# Patient Record
Sex: Female | Born: 1935 | Race: White | Hispanic: No | Marital: Married
Health system: Southern US, Community
[De-identification: ages and names within clinical notes are randomized; demographics above are authoritative.]

---

## 2010-02-18 ENCOUNTER — Ambulatory Visit: Payer: Self-pay

## 2010-03-16 ENCOUNTER — Ambulatory Visit: Payer: Self-pay | Admitting: Pain Medicine

## 2010-03-25 ENCOUNTER — Ambulatory Visit: Payer: Self-pay | Admitting: Pain Medicine

## 2010-04-12 ENCOUNTER — Ambulatory Visit: Payer: Self-pay | Admitting: Pain Medicine

## 2010-04-22 ENCOUNTER — Ambulatory Visit: Payer: Self-pay | Admitting: Pain Medicine

## 2010-05-04 ENCOUNTER — Ambulatory Visit: Payer: Self-pay | Admitting: Pain Medicine

## 2010-05-19 ENCOUNTER — Ambulatory Visit: Payer: Self-pay | Admitting: Pain Medicine

## 2011-03-18 ENCOUNTER — Ambulatory Visit: Payer: Self-pay | Admitting: Unknown Physician Specialty

## 2011-03-18 LAB — BASIC METABOLIC PANEL WITH GFR
Anion Gap: 8 (ref 7–16)
BUN: 25 mg/dL — ABNORMAL HIGH (ref 7–18)
Calcium, Total: 9.1 mg/dL (ref 8.5–10.1)
Chloride: 102 mmol/L (ref 98–107)
Co2: 28 mmol/L (ref 21–32)
Creatinine: 1.44 mg/dL — ABNORMAL HIGH (ref 0.60–1.30)
EGFR (African American): 45 — ABNORMAL LOW
EGFR (Non-African Amer.): 37 — ABNORMAL LOW
Glucose: 92 mg/dL (ref 65–99)
Osmolality: 280 (ref 275–301)
Potassium: 4.2 mmol/L (ref 3.5–5.1)
Sodium: 138 mmol/L (ref 136–145)

## 2011-03-18 LAB — CBC
HCT: 36.5 % (ref 35.0–47.0)
HGB: 12.2 g/dL (ref 12.0–16.0)
MCH: 31.5 pg (ref 26.0–34.0)
MCHC: 33.5 g/dL (ref 32.0–36.0)
MCV: 94 fL (ref 80–100)
Platelet: 238 x10 3/mm 3 (ref 150–440)
RBC: 3.89 X10 6/mm 3 (ref 3.80–5.20)
RDW: 14 % (ref 11.5–14.5)
WBC: 12.5 x10 3/mm 3 — ABNORMAL HIGH (ref 3.6–11.0)

## 2011-07-05 ENCOUNTER — Ambulatory Visit: Payer: Self-pay | Admitting: Orthopedic Surgery

## 2011-11-10 ENCOUNTER — Inpatient Hospital Stay: Payer: Self-pay | Admitting: Specialist

## 2011-11-10 LAB — URINALYSIS, COMPLETE
Bilirubin,UR: NEGATIVE
Glucose,UR: NEGATIVE mg/dL (ref 0–75)
Ketone: NEGATIVE
RBC,UR: 15 /HPF (ref 0–5)
Specific Gravity: 1.012 (ref 1.003–1.030)
Squamous Epithelial: NONE SEEN

## 2011-11-10 LAB — CBC WITH DIFFERENTIAL/PLATELET
Basophil #: 0.1 10*3/uL (ref 0.0–0.1)
Basophil %: 0.6 %
Eosinophil %: 1.3 %
HCT: 33.8 % — ABNORMAL LOW (ref 35.0–47.0)
HGB: 11.9 g/dL — ABNORMAL LOW (ref 12.0–16.0)
Lymphocyte #: 3.3 10*3/uL (ref 1.0–3.6)
Lymphocyte %: 21.7 %
MCH: 33.5 pg (ref 26.0–34.0)
MCHC: 35.2 g/dL (ref 32.0–36.0)
MCV: 95 fL (ref 80–100)
Monocyte %: 8.8 %
Neutrophil #: 10.3 10*3/uL — ABNORMAL HIGH (ref 1.4–6.5)

## 2011-11-10 LAB — PROTIME-INR
INR: 1.3
Prothrombin Time: 16.7 secs — ABNORMAL HIGH (ref 11.5–14.7)

## 2011-11-10 LAB — BASIC METABOLIC PANEL
BUN: 15 mg/dL (ref 7–18)
Calcium, Total: 8.8 mg/dL (ref 8.5–10.1)
Chloride: 105 mmol/L (ref 98–107)
Co2: 25 mmol/L (ref 21–32)
EGFR (African American): 49 — ABNORMAL LOW
Glucose: 119 mg/dL — ABNORMAL HIGH (ref 65–99)
Potassium: 3.8 mmol/L (ref 3.5–5.1)

## 2011-11-11 LAB — CBC WITH DIFFERENTIAL/PLATELET
Basophil %: 0.7 %
Eosinophil %: 2.1 %
HGB: 11.4 g/dL — ABNORMAL LOW (ref 12.0–16.0)
Lymphocyte #: 3.2 10*3/uL (ref 1.0–3.6)
MCH: 33.3 pg (ref 26.0–34.0)
MCV: 97 fL (ref 80–100)
Monocyte #: 1.8 x10 3/mm — ABNORMAL HIGH (ref 0.2–0.9)
Monocyte %: 15.3 %
RBC: 3.41 10*6/uL — ABNORMAL LOW (ref 3.80–5.20)
WBC: 11.7 10*3/uL — ABNORMAL HIGH (ref 3.6–11.0)

## 2011-11-11 LAB — BASIC METABOLIC PANEL
Co2: 25 mmol/L (ref 21–32)
Creatinine: 0.99 mg/dL (ref 0.60–1.30)
EGFR (African American): >60
EGFR (Non-African Amer.): 53 — ABNORMAL LOW
Glucose: 120 mg/dL — ABNORMAL HIGH (ref 65–99)
Sodium: 138 mmol/L (ref 136–145)

## 2011-11-12 LAB — BASIC METABOLIC PANEL
Anion Gap: 7 (ref 7–16)
BUN: 13 mg/dL (ref 7–18)
Creatinine: 1.16 mg/dL (ref 0.60–1.30)
EGFR (African American): 51 — ABNORMAL LOW
Glucose: 118 mg/dL — ABNORMAL HIGH (ref 65–99)
Potassium: 4.8 mmol/L (ref 3.5–5.1)

## 2011-11-15 ENCOUNTER — Encounter: Payer: Self-pay | Admitting: Internal Medicine

## 2011-11-15 LAB — URINALYSIS, COMPLETE
Hyaline Cast: 6
Ketone: NEGATIVE
Leukocyte Esterase: NEGATIVE
Nitrite: NEGATIVE
Ph: 6 (ref 4.5–8.0)
Protein: NEGATIVE
Specific Gravity: 1 (ref 1.003–1.030)
WBC UR: 2 /HPF (ref 0–5)

## 2011-11-15 LAB — HEMOGLOBIN: HGB: 8.5 g/dL — ABNORMAL LOW (ref 12.0–16.0)

## 2011-11-16 LAB — PATHOLOGY REPORT

## 2011-11-22 ENCOUNTER — Ambulatory Visit: Payer: Self-pay | Admitting: Internal Medicine

## 2011-12-02 ENCOUNTER — Encounter: Payer: Self-pay | Admitting: Internal Medicine

## 2014-05-20 NOTE — Op Note (Signed)
PATIENT NAME:  Christine BlanksBOWMAN, Valissa MR#:  [id number] DATE OF BIRTH:  [birth year]  DATE OF PROCEDURE:  11/11/2011  PREOPERATIVE DIAGNOSIS: Displaced left femoral neck fracture.   POSTOPERATIVE DIAGNOSIS: Displaced left femoral neck fracture.   PROCEDURE: Moore prosthetic left femoral head replacement.   SURGEON: Clare Gandyhristopher E. Jaylend Reiland, MD  ANESTHESIA: General.   COMPLICATIONS: None.   ESTIMATED BLOOD LOSS: 500 mL.   DRAINS: Two Hemovacs.   DESCRIPTION OF PROCEDURE: 1 gram of Ancef was given intravenously prior to the procedure. General anesthesia was induced. The patient was placed in the right lateral decubitus position in the usual manner for left hip surgery. Standard posterolateral incision is made and the dissection carried down to the fascia lata which is incised in line with the skin incision. Sciatic nerve is palpated deep within the wound and protected throughout the case. Piriformis is identified, cut and tagged off of the femur. The remainder of the external rotators are cut with the cutting Bovie. The capsule is then cut in a T fashion and the ends are tagged with Ethibond sutures. The femoral neck is then cut off at the appropriate length and angle. The femoral head is then removed and measured as a 46 mm in diameter. The acetabulum is irrigated and cleaned of all debris. Proximal femur is broached in the usual manner for a standard Moore prosthesis. The wound is thoroughly irrigated multiple times again including the femur and the acetabulum with pulsatile lavage. The standard 46 mm in diameter AT Moore prosthesis is impacted into place and is seen to be a solid fit. The hip is then reduced and is stable on range of motion. Posterior capsule is repaired and then reattached to the abductor mechanism as well as the piriformis tendon. Two Hemovac drains are brought up through separate stab wound incisions. Fascia lata is closed with Ethibond sutures. Subcutaneous tissue is closed with 2-0 Vicryl.  Skin is closed with the skin stapler. Soft bulky dressing is applied. The patient is turned over into the hospital bed and placed in the abduction pillow and returned to recovery room in satisfactory condition having tolerated the procedure quite well.   ____________________________ Clare Gandyhristopher E. Tanysha Quant, MD ces:cms D: 11/11/2011 11:25:59 ET T: 11/11/2011 11:52:55 ET JOB#: [id number]  cc: Clare Gandyhristopher E. Darivs Lunden, MD, <Dictator> Clare GandyHRISTOPHER E Phillip Sandler MD ELECTRONICALLY SIGNED 11/14/2011 17:30

## 2014-05-20 NOTE — Discharge Summary (Signed)
PATIENT NAME:  Christine Meza, Christine Meza MR#:  [id number] DATE OF BIRTH:  November 01, 1927  DATE OF ADMISSION:  11/10/2011 DATE OF DISCHARGE:  11/15/2011  DISCHARGE DIAGNOSES:  1. Displaced left subcapital femoral neck fracture.  2. Urinary tract infection.  3. Postoperative anemia.  4. Hypothyroidism.  5. Depression.  6. Osteoporosis.  7. History of gastric ulcer.  8. Gastroesophageal reflux disease.   OPERATIONS/PROCEDURES PERFORMED: Moore prosthetic femoral head replacement on 11/11/2011.   HISTORY AND PHYSICAL EXAMINATION: As written on admission.   LABORATORY DATA: As noted in the chart.   HOSPITAL COURSE: The patient was admitted through the Emergency Room and was seen by [facility] for preoperative medical clearance. Their note is as noted within the chart. Laboratory data is as noted in the chart. The patient was taken to the Operating Room on 11/11/2011 where Moore prosthetic femoral head replacement was performed. She tolerated the procedure quite well and generally had a benign postoperative course. She was advanced up into the chair and physical therapy with total hip precautions and weight-bearing as tolerated. She did develop a urinary tract infection and was treated with Septra twice a day. Her hemoglobin stabilized at 8.5, on 11/15/2011, and was felt to be satisfactory. She is to be discharged to rehab. Her orders are as        on the printed order sheet. Please remove her staples in 10 days if her wound is seen to be healing in quite well. Please discontinue her Septra in seven days and then culture her urine. She is to return to the office to see Dr. Katrinka BlazingSmith in three weeks.  ____________________________ Clare Gandyhristopher E. Al Gagen, MD ces:slb D: 11/15/2011 10:35:04 ET T: 11/15/2011 10:51:40 ET JOB#: [id number]  cc: Clare Gandyhristopher E. Tawnie Ehresman, MD, <Dictator> Clare GandyHRISTOPHER E Camar Guyton MD ELECTRONICALLY SIGNED 11/18/2011 7:07

## 2014-05-20 NOTE — Consult Note (Signed)
PATIENT NAME:  Christine Meza, Adlyn MR#:  [id number] DATE OF BIRTH:  Mar 08, 1927  DATE OF CONSULTATION:  11/10/2011  REFERRING PHYSICIAN:  Myra Rudehristopher Smith, MD CONSULTING PHYSICIAN:  Starleen Armsawood S. Mackinzie Vuncannon, MD  REFERRING PHYSICIAN: Dr. Einar CrowMarshall Anderson   CHIEF COMPLAINT: Status post fall and left hip fracture for preop evaluation.   HISTORY OF PRESENT ILLNESS: This is an 81 year old female who lives at home with significant past medical history of hypothyroidism, depression, osteoporosis, and gastroesophageal reflux disease who presents status post mechanical fall. The patient was on the stairs when she was trying to turn around. She fell on her left side and hit her left hip area and left buttocks and complained of significant pain after that. The patient denies any head trauma, any loss of consciousness, any dizziness, altered mental status, or lightheadedness. No chest pain, no shortness of breath, and no palpitations. In the ED the patient had an x-ray which did show a subcapital fracture of the left hip as well as a CT of the head which did not show any acute findings.  The patient had a basic work-up done which did show some mild leukocytosis of 14.2 and a mild urinary tract infection on her urinalysis. The patient was admitted by the orthopedic service and the medical service was consulted for preop evaluation.   PAST MEDICAL HISTORY:  1. Hypothyroidism.  2. Depression.  3. Osteoporosis.  4. History of gastric ulcer.  5. Gastroesophageal reflux disease.   PAST SURGICAL HISTORY:  1. Carpal tunnel surgery.  2. Hysterectomy.  3. Bladder sling.  4. History of stomach surgery due to gastric ulcer, unclear which kind of surgery.   SOCIAL HISTORY: The patient lives at home with her family, has a remote history of smoking, quit almost 50 years ago. No illicit drug or alcohol use.   FAMILY HISTORY: She denies any family history of diabetes or cardiac disease.   ALLERGIES: No known drug allergies.    HOME MEDICATIONS:  1. Tylenol 650, 2 times a day.  2. Tramadol 50 mg, 1 to 2 tablets 4 times a day as needed.  3. Citalopram 40 mg oral daily.  4. Omeprazole 20 mg daily.  5. Levothyroxine 75 mcg daily.  6. Multivitamin 1 tablet oral daily.   REVIEW OF SYSTEMS: Denies any fever, fatigue, weakness. EYES: Denies blurry vision, double vision, or pain. ENT: Denies tinnitus, ear pain, or hearing loss. RESPIRATORY: Denies cough, wheezing, hemoptysis, or dyspnea. CARDIOVASCULAR: Denies chest pain, orthopnea, edema, arrhythmia, palpitations, or syncope. GI: Denies nausea, vomiting, diarrhea, abdominal pain, or hematemesis.  GU: Denies dysuria, hematuria, or renal colic. ENDOCRINE: Denies polyuria, polydipsia, heat or cold intolerance. HEMATOLOGIC: Denies anemia, easy bruising, or bleeding diathesis. INTEGUMENT: Denies acne, rash, or lesions. MUSCULOSKELETAL: Has complaints of lower back pain, chronic. Denies any arthritis, swelling, or gout. NEUROLOGICAL: Denies numbness, dysarthria, epilepsy, tremors, vertigo, ataxia, or dementia.  PSYCHIATRIC: Denies anxiety, insomnia, alcohol abuse, or schizophrenia.   PHYSICAL EXAMINATION:  VITAL SIGNS: Temperature 96.2, pulse 100, respiratory rate 22, blood pressure 140/85, saturating 98% on room air.   GENERAL: Elderly female, looks comfortable, in no apparent distress.   HEENT: Head atraumatic, normocephalic. Pupils equal, reactive to light. Pink conjunctivae. Anicteric sclerae. Moist oral mucosa.   NECK: Supple. No thyromegaly. No JVD.   CHEST: Good air entry bilaterally. No wheezing, rales, or rhonchi.   CARDIOVASCULAR: S1, S2 heard. No rubs, murmurs, or gallops.   ABDOMEN: Soft, nontender, nondistended. Bowel sounds present.   EXTREMITIES: No edema. No clubbing, no cyanosis.  PSYCHIATRIC: Appropriate affect. Awake, alert times three. Intact judgment and insight.   NEUROLOGIC: Cranial nerves grossly intact. Motor five out of five in all extremities.  Motion limited in the left lower extremity secondary to pain.   SKIN: Normal skin turgor. Has left elbow skin tear.   PERTINENT LABORATORY AND RADIOLOGICAL DATA: Glucose 119, BUN 15, creatinine 1.21, sodium 141, potassium 3.8, chloride 105, CO2 25, white blood cells 15.2, hemoglobin 11.9, hematocrit 33.8, and platelets 225. Urinalysis showing nitrites negative, leukocyte esterase +1. White blood cells 19, bacteria +3. CT of the brain shows no acute intracranial abnormalities with cerebral atrophy and chronic small vessel ischemic changes and EKG showing normal sinus rhythm at 99 beats per minute without any significant ST or T wave changes.   ASSESSMENT AND PLAN:  1. Gait disturbance status post mechanical fall with left hip fracture:  This was a mechanical fall. It was not preceded by any altered mental status, dizziness, or lightheadedness. The patient has no complaints of chest pain, no complaints of shortness of breath, no significant cardiac history, no palpitations. The patient is currently a moderate risk for surgical intervention. The patient was admitted under the surgical service with the plan for surgery on Friday,  will be kept n.p.o. after midnight, and on fluids after that.  2. Urinary tract infection: The patient will be started on Rocephin. We will follow urine culture and adjust antibiotics accordingly.  3. Hypothyroidism: Continue with Synthroid.  4. Depression: Continue with citalopram. 5. Gastroesophageal reflux disease and history of gastric ulcer: Continue with omeprazole. 6. Deep vein thrombosis prophylaxis: Subcutaneous heparin.   TOTAL TIME SPENT ON MEDICAL CONSULT: 45 minutes.  ____________________________ Starleen Arms, MD dse:bjt D:  11/10/2011 04:30:48 ET          T: 11/10/2011 07:07:40 ET         JOB#: [id number]  cc: Marya Amsler. Dareen Piano, MD Yaileen Hofferber Teena Irani MD ELECTRONICALLY SIGNED 11/12/2011 0:17

## 2014-05-25 NOTE — Op Note (Signed)
PATIENT NAME:  Christine Meza, Christine Meza MR#:  [id number] DATE OF BIRTH:  Oct 09, 1927  DATE OF PROCEDURE:  03/18/2011  PREOPERATIVE DIAGNOSES:  1. Right carpal tunnel syndrome.  2. Trigger right ring finger.   POSTOPERATIVE DIAGNOSES:  1. Right carpal tunnel syndrome.  2. Trigger right ring finger.   PROCEDURES:  1. Open release median nerve, right wrist.  2. Open release of flexor sheath and release of trigger right fourth finger.   SURGEON: Winn JockJames C. Cardale Dorer, M.D.   ASSISTANT: None.   ANESTHESIA: MAC.   ESTIMATED BLOOD LOSS: Negligible.   COMPLICATIONS: None.   TOURNIQUET: Not used.   BRIEF CLINICAL NOTE AND PATHOLOGY: The patient had persistent numbness and tingling with documented carpal tunnel syndrome as well as triggering of her ring finger. Options, risks, and benefits were discussed and she elected to proceed with operative intervention. At the time of the procedure, there was very significant compression of the median nerve as well as stenosis of the tendon sheath. There was a small area of abraded tendon, which was debrided.   DESCRIPTION OF PROCEDURE: Preop antibiotics, adequate MAC anesthesia, supine position. Routine prepping and draping. Appropriate time-out was called.   The carpal tunnel incision was identified and outlined.  The area was injected with plain Marcaine. A curvilinear incision was made paralleling the thenar crease in line with the ring finger. The subcutaneous tissues were carefully dissected. Hemostasis was obtained with the bipolar cautery. The transverse carpal ligament was released throughout its entirety. The proximal fascia was released subcutaneously. The carpal canal was explored and no further pathology noted. The skin was closed with Monocryl after thorough irrigation.   Attention was then turned to the trigger ring finger where a volar incision was made over the MCP flexor crease. The subcutaneous tissues were dissected, neurovascular structures were  protected. The pulley was identified and was released. The tendon was debrided. The digit was taken through a range of motion with no further impingement. The incision was thoroughly irrigated. It was closed with Monocryl.   Soft sterile dressing was applied. The patient was awakened and taken to the postanesthesia care unit having tolerated the procedure well.    ____________________________ Winn JockJames C. Gerrit Heckaliff, MD jcc:bjt D: 03/18/2011 12:22:12 ET T: 03/18/2011 14:05:48 ET JOB#: [id number]  cc: Winn JockJames C. Gerrit Heckaliff, MD, <Dictator> Winn JockJAMES C Cope Marte MD ELECTRONICALLY SIGNED 03/19/2011 5:15

## 2014-05-25 NOTE — Op Note (Signed)
PATIENT NAME:  Christine Meza, Christine Meza MR#:  [id number] DATE OF BIRTH:  [birth year]  DATE OF PROCEDURE:  07/05/2011  PREOPERATIVE DIAGNOSIS: Left carpal tunnel syndrome.   POSTOPERATIVE DIAGNOSIS: Left carpal tunnel syndrome.   PROCEDURE: Left carpal tunnel release.   ANESTHESIA: MAC with local.   SURGEON: Leitha SchullerMichael J. Teigen Bellin, MD  DESCRIPTION OF PROCEDURE: Patient was brought to the Operating Room and after adequate sedation was given, 10 mL of local anesthetic was infiltrated in the area of the planned incision; 5 mL of 1% Xylocaine, 5 mL of 0.5% Sensorcaine without epinephrine. This was done after prepping the skin with Betadine. The arm was then prepped and draped in usual sterile fashion with a tourniquet applied to the upper arm. After patient identification and timeout procedures were completed again, tourniquet raised to 250 mmHg and a skin incision was made in line with the ring metacarpal in a palmar crease, approximately 2 cm incision made and subcutaneous tissue spread, retractors placed and the transverse carpal ligament identified and incised. A small vascular clamp was then placed underneath the ligament to protect the underlying structures. Release was carried out distally first getting out to the distal carpal tunnel where fat was noted around the nerve consistent with no compression. Going proximally incision was made proximally in the proximal portion of the carpal tunnel. There did appear to be compression on the nerve. After release there was good vascular blush to the nerve. Release was carried out to about the level of the proximal wrist flexion crease subcutaneously, skin incision was made distal to this. After apparent release of the ligament and adequate release of the nerve the carpal tunnel was not noted to have any masses. The wound was then irrigated and then closed with simple interrupted 5-0 nylon. A sterile dressing of Xeroform, 4 x 4's, Webril, and Ace wrap applied and the patient  sent to recovery room in stable condition.   ESTIMATED BLOOD LOSS: Minimal.   COMPLICATIONS: None.   SPECIMEN: None.   TOURNIQUET TIME: 11 minutes at 250 mmHg.  ____________________________ Leitha SchullerMichael J. Takeila Thayne, MD mjm:cms D: 07/05/2011 21:54:13 ET T: 07/06/2011 09:10:47 ET JOB#: [id number]  cc: Leitha SchullerMichael J. Guilherme Schwenke, MD, <Dictator> Leitha SchullerMICHAEL J Nivea Wojdyla MD ELECTRONICALLY SIGNED 07/06/2011 12:43

## 2015-05-01 DIAGNOSIS — Z85828 Personal history of other malignant neoplasm of skin: Secondary | ICD-10-CM | POA: Diagnosis not present

## 2015-05-01 DIAGNOSIS — L408 Other psoriasis: Secondary | ICD-10-CM | POA: Diagnosis not present

## 2015-05-01 DIAGNOSIS — Z08 Encounter for follow-up examination after completed treatment for malignant neoplasm: Secondary | ICD-10-CM | POA: Diagnosis not present

## 2015-07-13 DIAGNOSIS — E7800 Pure hypercholesterolemia, unspecified: Secondary | ICD-10-CM | POA: Diagnosis not present

## 2015-07-13 DIAGNOSIS — E039 Hypothyroidism, unspecified: Secondary | ICD-10-CM | POA: Diagnosis not present

## 2015-07-13 DIAGNOSIS — N183 Chronic kidney disease, stage 3 (moderate): Secondary | ICD-10-CM | POA: Diagnosis not present

## 2015-07-13 DIAGNOSIS — F325 Major depressive disorder, single episode, in full remission: Secondary | ICD-10-CM | POA: Diagnosis not present

## 2015-11-13 DIAGNOSIS — L821 Other seborrheic keratosis: Secondary | ICD-10-CM | POA: Diagnosis not present

## 2015-11-13 DIAGNOSIS — L853 Xerosis cutis: Secondary | ICD-10-CM | POA: Diagnosis not present

## 2016-01-14 DIAGNOSIS — E039 Hypothyroidism, unspecified: Secondary | ICD-10-CM | POA: Diagnosis not present

## 2016-01-14 DIAGNOSIS — E7800 Pure hypercholesterolemia, unspecified: Secondary | ICD-10-CM | POA: Diagnosis not present

## 2016-01-14 DIAGNOSIS — Z23 Encounter for immunization: Secondary | ICD-10-CM | POA: Diagnosis not present

## 2016-01-14 DIAGNOSIS — F325 Major depressive disorder, single episode, in full remission: Secondary | ICD-10-CM | POA: Diagnosis not present

## 2016-01-14 DIAGNOSIS — Z0000 Encounter for general adult medical examination without abnormal findings: Secondary | ICD-10-CM | POA: Diagnosis not present

## 2016-02-12 DIAGNOSIS — E7800 Pure hypercholesterolemia, unspecified: Secondary | ICD-10-CM | POA: Diagnosis not present

## 2016-02-12 DIAGNOSIS — F325 Major depressive disorder, single episode, in full remission: Secondary | ICD-10-CM | POA: Diagnosis not present

## 2016-02-12 DIAGNOSIS — E039 Hypothyroidism, unspecified: Secondary | ICD-10-CM | POA: Diagnosis not present

## 2016-07-14 DIAGNOSIS — E039 Hypothyroidism, unspecified: Secondary | ICD-10-CM | POA: Diagnosis not present

## 2016-07-14 DIAGNOSIS — E7800 Pure hypercholesterolemia, unspecified: Secondary | ICD-10-CM | POA: Diagnosis not present

## 2016-07-14 DIAGNOSIS — Z0000 Encounter for general adult medical examination without abnormal findings: Secondary | ICD-10-CM | POA: Diagnosis not present

## 2016-07-14 DIAGNOSIS — K219 Gastro-esophageal reflux disease without esophagitis: Secondary | ICD-10-CM | POA: Diagnosis not present

## 2016-07-14 DIAGNOSIS — F325 Major depressive disorder, single episode, in full remission: Secondary | ICD-10-CM | POA: Diagnosis not present

## 2016-07-14 DIAGNOSIS — N183 Chronic kidney disease, stage 3 (moderate): Secondary | ICD-10-CM | POA: Diagnosis not present

## 2016-08-08 ENCOUNTER — Other Ambulatory Visit: Payer: Self-pay | Admitting: Family Medicine

## 2016-08-08 ENCOUNTER — Ambulatory Visit
Admission: RE | Admit: 2016-08-08 | Discharge: 2016-08-08 | Disposition: A | Discharge status: Home / Self Care | Payer: PPO | Source: Ambulatory Visit | Attending: Family Medicine | Admitting: Family Medicine

## 2016-08-08 DIAGNOSIS — H538 Other visual disturbances: Secondary | ICD-10-CM | POA: Diagnosis not present

## 2016-08-08 DIAGNOSIS — R51 Headache: Secondary | ICD-10-CM | POA: Insufficient documentation

## 2016-08-08 DIAGNOSIS — I6523 Occlusion and stenosis of bilateral carotid arteries: Secondary | ICD-10-CM | POA: Insufficient documentation

## 2016-08-08 DIAGNOSIS — R42 Dizziness and giddiness: Secondary | ICD-10-CM | POA: Diagnosis not present

## 2016-08-08 DIAGNOSIS — R519 Headache, unspecified: Secondary | ICD-10-CM

## 2016-08-09 ENCOUNTER — Other Ambulatory Visit: Payer: Self-pay | Admitting: Ophthalmology

## 2016-08-09 DIAGNOSIS — H532 Diplopia: Secondary | ICD-10-CM

## 2016-08-09 DIAGNOSIS — H353131 Nonexudative age-related macular degeneration, bilateral, early dry stage: Secondary | ICD-10-CM | POA: Diagnosis not present

## 2016-08-09 DIAGNOSIS — R7982 Elevated C-reactive protein (CRP): Secondary | ICD-10-CM | POA: Diagnosis not present

## 2016-08-09 DIAGNOSIS — R51 Headache: Secondary | ICD-10-CM | POA: Diagnosis not present

## 2016-08-09 DIAGNOSIS — G8929 Other chronic pain: Secondary | ICD-10-CM

## 2016-08-15 ENCOUNTER — Ambulatory Visit
Admission: RE | Admit: 2016-08-15 | Discharge: 2016-08-15 | Disposition: A | Discharge status: Home / Self Care | Payer: PPO | Source: Ambulatory Visit | Attending: Ophthalmology | Admitting: Ophthalmology

## 2016-08-15 DIAGNOSIS — I739 Peripheral vascular disease, unspecified: Secondary | ICD-10-CM | POA: Diagnosis not present

## 2016-08-15 DIAGNOSIS — H532 Diplopia: Secondary | ICD-10-CM | POA: Diagnosis not present

## 2016-08-15 DIAGNOSIS — R51 Headache: Secondary | ICD-10-CM | POA: Insufficient documentation

## 2016-08-15 DIAGNOSIS — R42 Dizziness and giddiness: Secondary | ICD-10-CM | POA: Diagnosis not present

## 2016-08-15 DIAGNOSIS — G8929 Other chronic pain: Secondary | ICD-10-CM

## 2016-08-15 LAB — POCT I-STAT CREATININE: Creatinine, Ser: 1.4 mg/dL — ABNORMAL HIGH (ref 0.44–1.00)

## 2016-08-15 MED ORDER — GADOBENATE DIMEGLUMINE 529 MG/ML IV SOLN
10.0000 mL | Freq: Once | INTRAVENOUS | Status: AC | PRN
  Administered 2016-08-15: 7 mL via INTRAVENOUS

## 2016-08-18 ENCOUNTER — Ambulatory Visit: Payer: Self-pay

## 2016-08-22 DIAGNOSIS — H532 Diplopia: Secondary | ICD-10-CM | POA: Diagnosis not present

## 2017-01-11 DIAGNOSIS — E7800 Pure hypercholesterolemia, unspecified: Secondary | ICD-10-CM | POA: Diagnosis not present

## 2017-01-11 DIAGNOSIS — N183 Chronic kidney disease, stage 3 (moderate): Secondary | ICD-10-CM | POA: Diagnosis not present

## 2017-01-11 DIAGNOSIS — E039 Hypothyroidism, unspecified: Secondary | ICD-10-CM | POA: Diagnosis not present

## 2017-01-13 DIAGNOSIS — N183 Chronic kidney disease, stage 3 (moderate): Secondary | ICD-10-CM | POA: Diagnosis not present

## 2017-01-13 DIAGNOSIS — M159 Polyosteoarthritis, unspecified: Secondary | ICD-10-CM | POA: Diagnosis not present

## 2017-01-13 DIAGNOSIS — F325 Major depressive disorder, single episode, in full remission: Secondary | ICD-10-CM | POA: Diagnosis not present

## 2017-01-13 DIAGNOSIS — K219 Gastro-esophageal reflux disease without esophagitis: Secondary | ICD-10-CM | POA: Diagnosis not present

## 2017-01-13 DIAGNOSIS — E7800 Pure hypercholesterolemia, unspecified: Secondary | ICD-10-CM | POA: Diagnosis not present

## 2017-01-13 DIAGNOSIS — E039 Hypothyroidism, unspecified: Secondary | ICD-10-CM | POA: Diagnosis not present

## 2017-04-06 DIAGNOSIS — E039 Hypothyroidism, unspecified: Secondary | ICD-10-CM | POA: Diagnosis not present

## 2017-04-13 DIAGNOSIS — F325 Major depressive disorder, single episode, in full remission: Secondary | ICD-10-CM | POA: Diagnosis not present

## 2017-04-13 DIAGNOSIS — K219 Gastro-esophageal reflux disease without esophagitis: Secondary | ICD-10-CM | POA: Diagnosis not present

## 2017-04-13 DIAGNOSIS — N183 Chronic kidney disease, stage 3 (moderate): Secondary | ICD-10-CM | POA: Diagnosis not present

## 2017-04-13 DIAGNOSIS — E039 Hypothyroidism, unspecified: Secondary | ICD-10-CM | POA: Diagnosis not present

## 2017-04-13 DIAGNOSIS — E7800 Pure hypercholesterolemia, unspecified: Secondary | ICD-10-CM | POA: Diagnosis not present

## 2017-05-12 DIAGNOSIS — M542 Cervicalgia: Secondary | ICD-10-CM | POA: Diagnosis not present

## 2017-05-12 DIAGNOSIS — R252 Cramp and spasm: Secondary | ICD-10-CM | POA: Diagnosis not present

## 2017-05-15 DIAGNOSIS — S29012A Strain of muscle and tendon of back wall of thorax, initial encounter: Secondary | ICD-10-CM | POA: Diagnosis not present

## 2017-05-15 DIAGNOSIS — M9901 Segmental and somatic dysfunction of cervical region: Secondary | ICD-10-CM | POA: Diagnosis not present

## 2017-05-15 DIAGNOSIS — M9902 Segmental and somatic dysfunction of thoracic region: Secondary | ICD-10-CM | POA: Diagnosis not present

## 2017-05-15 DIAGNOSIS — S138XXA Sprain of joints and ligaments of other parts of neck, initial encounter: Secondary | ICD-10-CM | POA: Diagnosis not present

## 2017-05-18 DIAGNOSIS — S29012A Strain of muscle and tendon of back wall of thorax, initial encounter: Secondary | ICD-10-CM | POA: Diagnosis not present

## 2017-05-18 DIAGNOSIS — M9901 Segmental and somatic dysfunction of cervical region: Secondary | ICD-10-CM | POA: Diagnosis not present

## 2017-05-18 DIAGNOSIS — M9902 Segmental and somatic dysfunction of thoracic region: Secondary | ICD-10-CM | POA: Diagnosis not present

## 2017-05-18 DIAGNOSIS — S138XXA Sprain of joints and ligaments of other parts of neck, initial encounter: Secondary | ICD-10-CM | POA: Diagnosis not present

## 2017-05-22 DIAGNOSIS — M9902 Segmental and somatic dysfunction of thoracic region: Secondary | ICD-10-CM | POA: Diagnosis not present

## 2017-05-22 DIAGNOSIS — S138XXA Sprain of joints and ligaments of other parts of neck, initial encounter: Secondary | ICD-10-CM | POA: Diagnosis not present

## 2017-05-22 DIAGNOSIS — S29012A Strain of muscle and tendon of back wall of thorax, initial encounter: Secondary | ICD-10-CM | POA: Diagnosis not present

## 2017-05-22 DIAGNOSIS — M9901 Segmental and somatic dysfunction of cervical region: Secondary | ICD-10-CM | POA: Diagnosis not present

## 2019-06-21 IMAGING — MR MR HEAD WO/W CM
12 series · 35 of 48 positions shown · IV contrast (multihance)
Comparison: CT head 08/08/2016.

CLINICAL DATA: Severe headache, dizziness, and double vision.

EXAM:
MRI HEAD WITHOUT AND WITH CONTRAST
TECHNIQUE: Multiplanar, multiecho pulse sequences of the brain and surrounding
structures were obtained without and with intravenous contrast.
CONTRAST:  MultiHance 7 mL.

[Series 2: T1 · sagittal · 5.0mm · 0.47mm/px · 1 of 23 slices shown (1 of 2)]
[im 1/23]
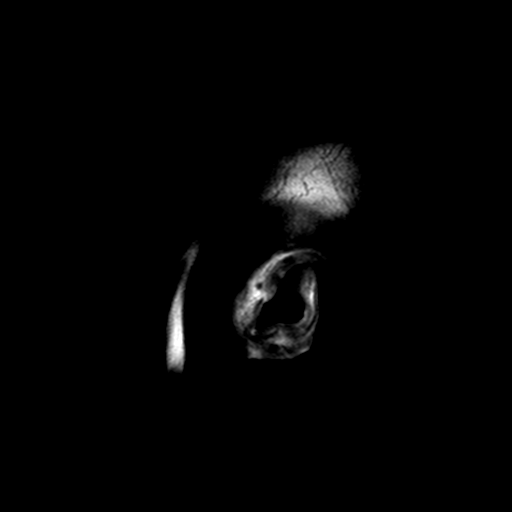

[Series 4: DWI · axial · 3.0mm · 0.94mm/px · z∈[-32,+109]mm · 3 of 51 slices shown (1 of 4)]
[im 1/51]
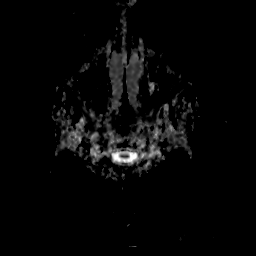
[im 26/51]
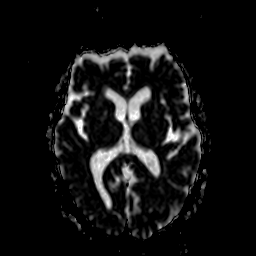
[im 51/51]
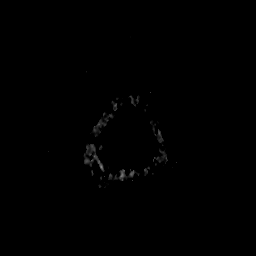

[Series 5: DWI · axial · 3.0mm · 0.94mm/px · z∈[-30,+108]mm · 4 of 50 slices shown (2 of 4)]
[im 1/50]
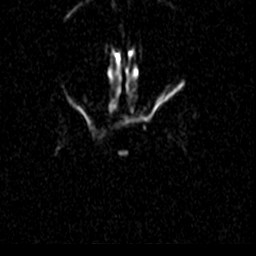
[im 17/50]
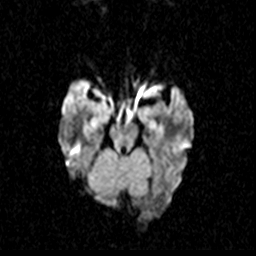
[im 33/50]
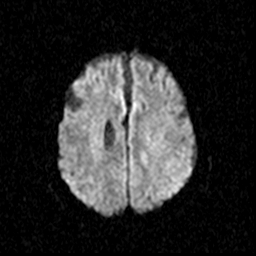
[im 50/50]
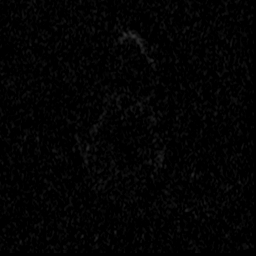

[Series 7: DWI · coronal · 5.0mm · 1.80mm/px · 3 of 39 slices shown (3 of 4)]
[im 1/39]
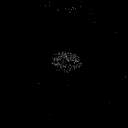
[im 20/39]
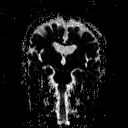
[im 39/39]
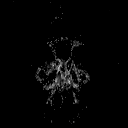

[Series 8: DWI · coronal · 5.0mm · 1.80mm/px · 3 of 38 slices shown (4 of 4)]
[im 1/38]
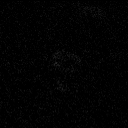
[im 19/38]
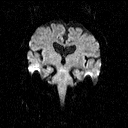
[im 38/38]
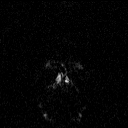

[Series 9: T2 · axial · 5.0mm · 0.45mm/px · z∈[-36,+109]mm · 2 of 23 slices shown (1 of 2)]
[im 1/23]
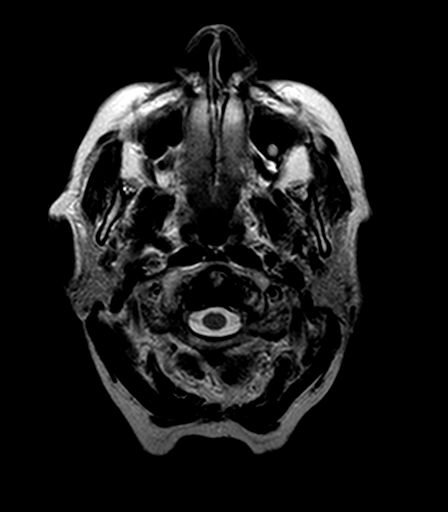
[im 23/23]
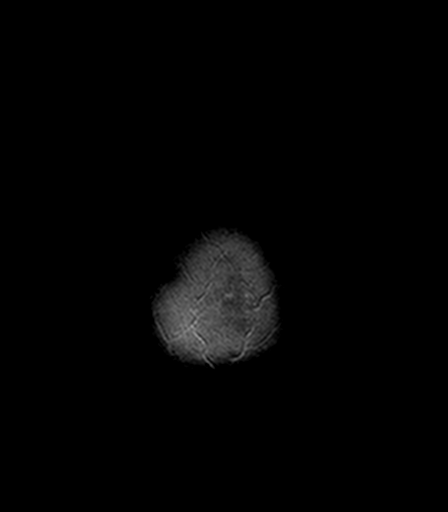

[Series 10: FLAIR · axial · 3.0mm · 0.90mm/px · z∈[-33,+106]mm · 4 of 50 slices shown]
[im 1/50]
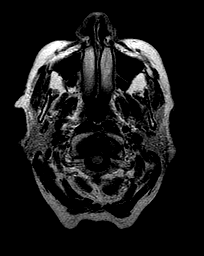
[im 17/50]
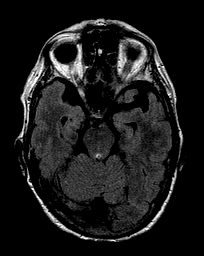
[im 33/50]
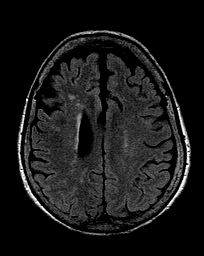
[im 50/50]
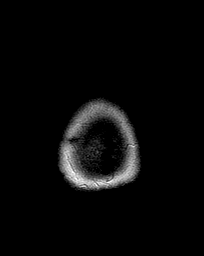

[Series 11: T2 · axial · 5.0mm · 0.45mm/px · z∈[-36,+109]mm · 2 of 23 slices shown (2 of 2)]
[im 1/23]
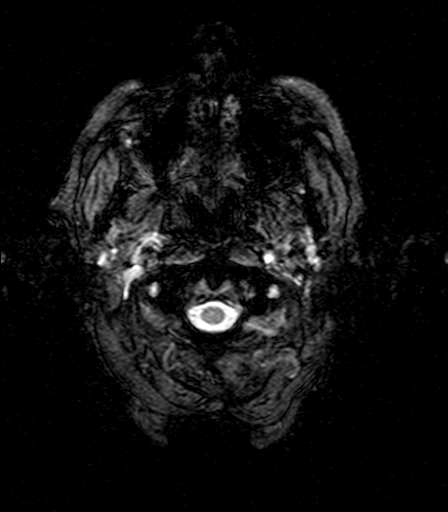
[im 23/23]
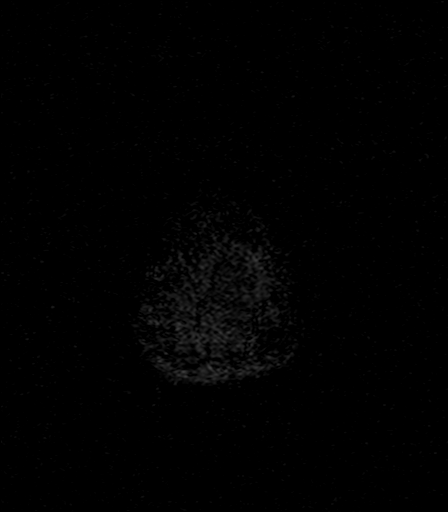

[Series 12: T1 · axial · 1.0mm · 0.45mm/px · 1 of 160 slices shown (2 of 2)]
[im 1/160]
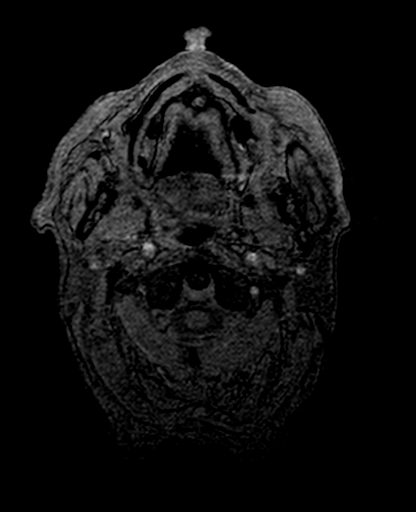

[Series 13: T2 post-contrast · coronal · 5.0mm · 0.45mm/px · 2 of 29 slices shown]
[im 1/29]
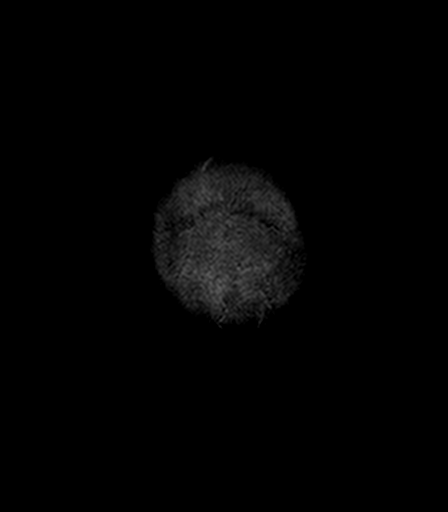
[im 29/29]
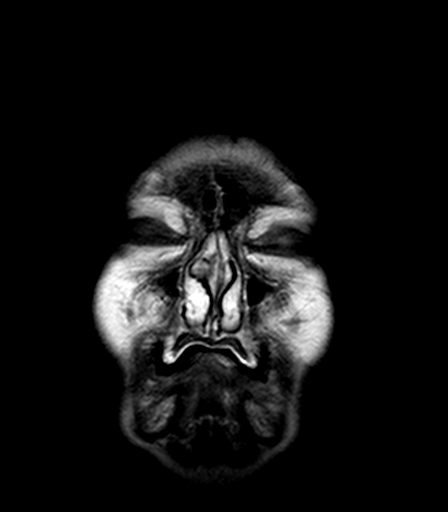

[Series 14: T1 post-contrast · axial · 1.0mm · 0.45mm/px · z∈[-40,+109]mm · 8 of 160 slices shown (1 of 2)]
[im 1/160]
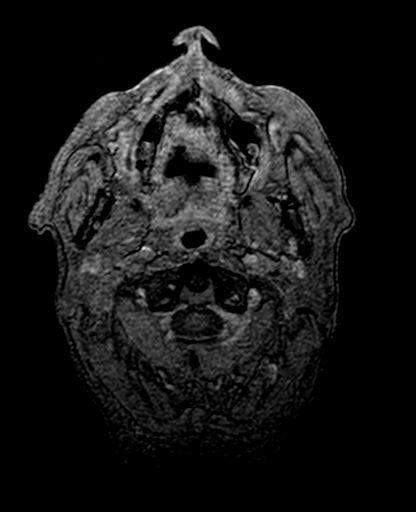
[im 32/160]
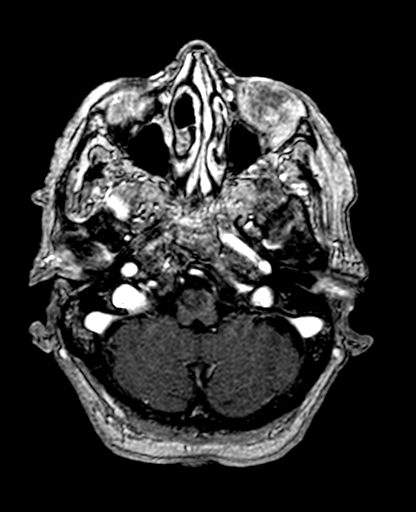
[im 48/160]
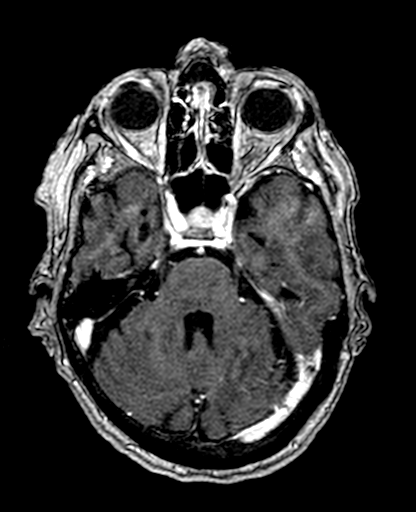
[im 64/160]
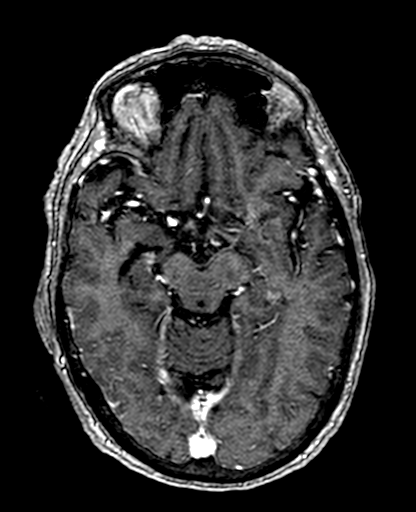
[im 96/160]
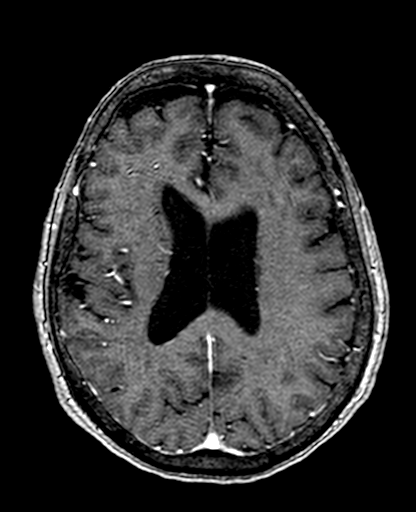
[im 112/160]
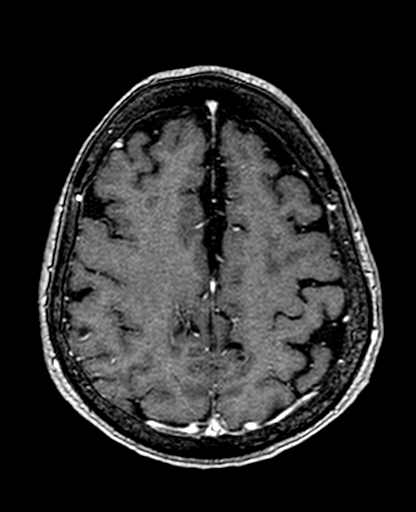
[im 128/160]
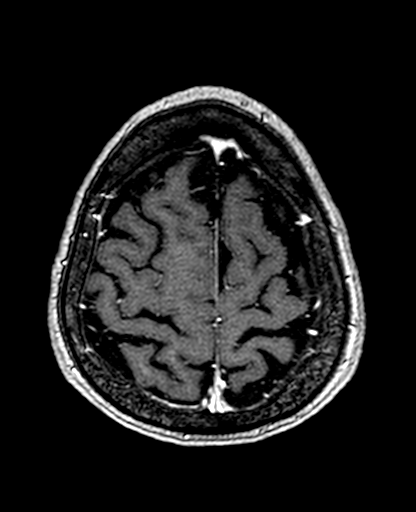
[im 160/160]
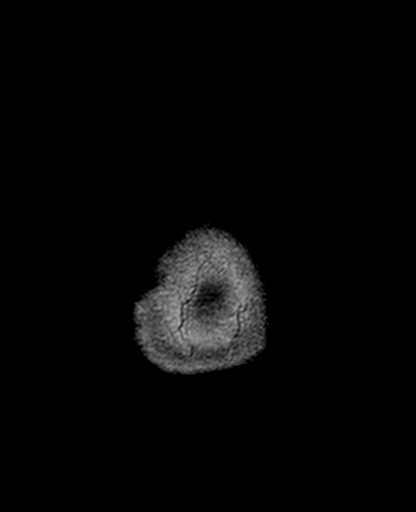

[Series 15: T1 post-contrast · coronal · 5.0mm · 0.45mm/px · 2 of 29 slices shown (2 of 2)]
[im 1/29]
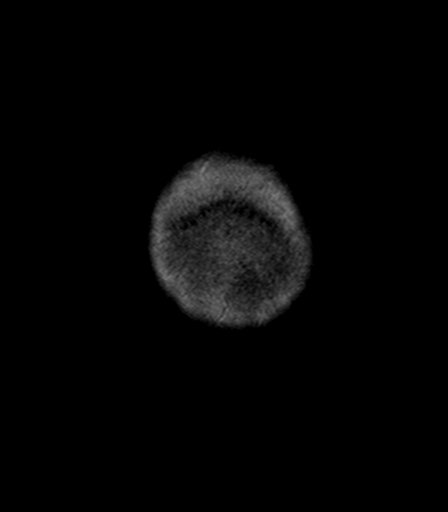
[im 29/29]
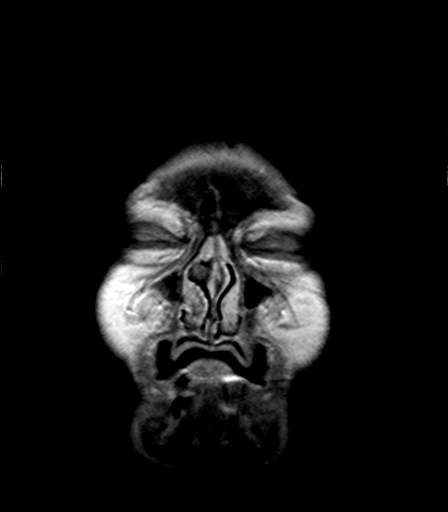

[35 of 48 positions shown; findings below may reference images not displayed]

FINDINGS: Brain: No evidence for acute infarction, hemorrhage, mass lesion,
hydrocephalus, or extra-axial fluid. Generalized atrophy. Chronic
microvascular ischemic change.

Post infusion, no abnormal enhancement of the brain or meninges.

Vascular: Flow voids are maintained throughout the carotid, basilar,
and vertebral arteries. There are no areas of chronic hemorrhage.

Skull and upper cervical spine: Normal marrow signal. Moderate
pannus surrounds the odontoid, without cervicomedullary compression.
There may be some mild osseous remodeling of the dens. No C1-C2
instability is evident.

Sinuses/Orbits: No orbital masses or proptosis. Globes appear
symmetric. Sinuses appear well aerated, without evidence for
air-fluid level.

Other: None.
IMPRESSION: Atrophy and small vessel disease. No acute intracranial findings. No
abnormal postcontrast enhancement.

No central cause is seen for diplopia.

## 2021-05-31 DEATH — deceased
